# Patient Record
Sex: Female | Born: 2000 | Race: White | Hispanic: No | Marital: Single | Smoking: Never smoker
Health system: Southern US, Community
[De-identification: ages and names within clinical notes are randomized; demographics above are authoritative.]

## PROBLEM LIST (undated history)

## (undated) DIAGNOSIS — J45909 Unspecified asthma, uncomplicated: Secondary | ICD-10-CM

## (undated) HISTORY — PX: TYMPANOSTOMY TUBE PLACEMENT: SHX32

---
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
## 2022-05-06 IMAGING — CR DG CHEST 2V
1 series · 2 of 2 positions shown · non-contrast
Comparison: None.

CLINICAL DATA: Cough, congestion, and shortness of breath.

EXAM:
CHEST - 2 VIEW

[Series 1: dg chest 2 view · 0.14mm/px · 2 of 2 slices shown]
[im 1/2]
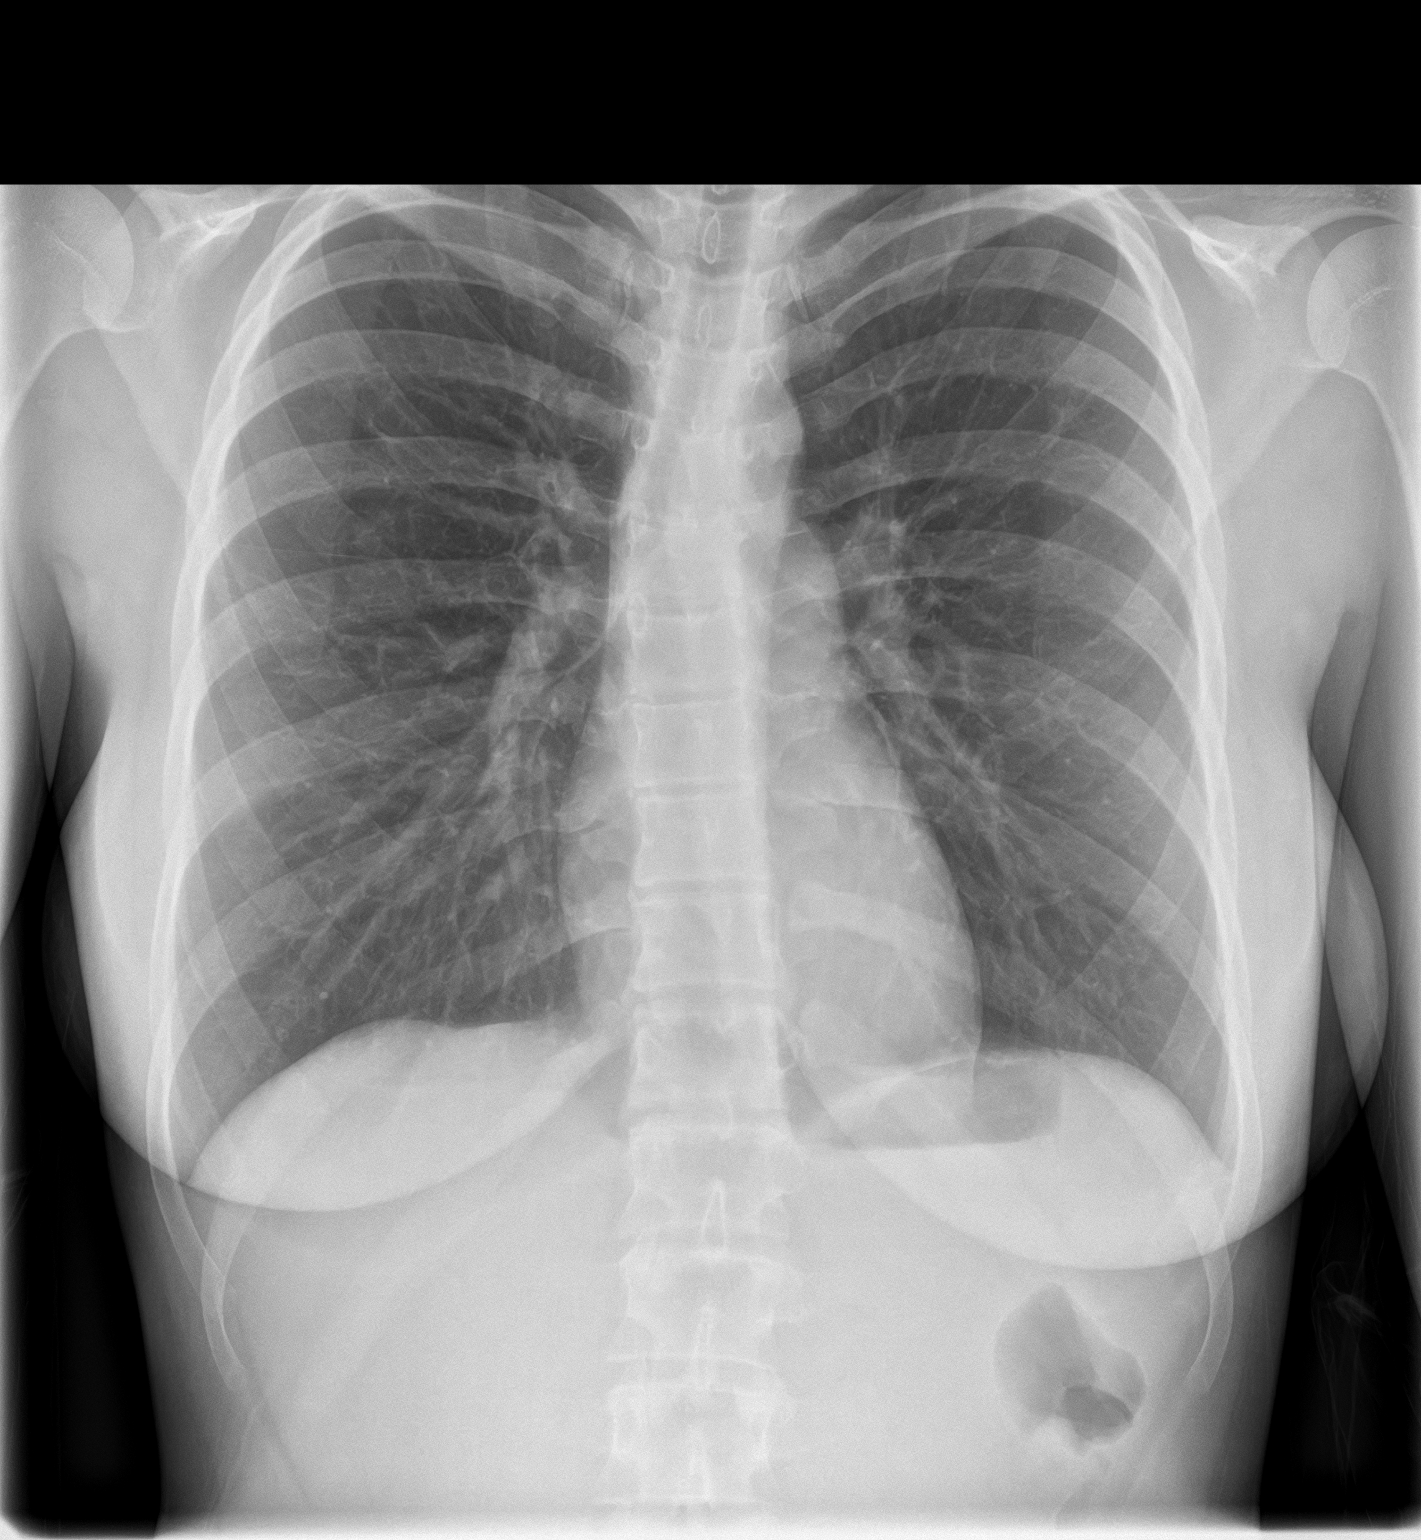
[im 2/2]
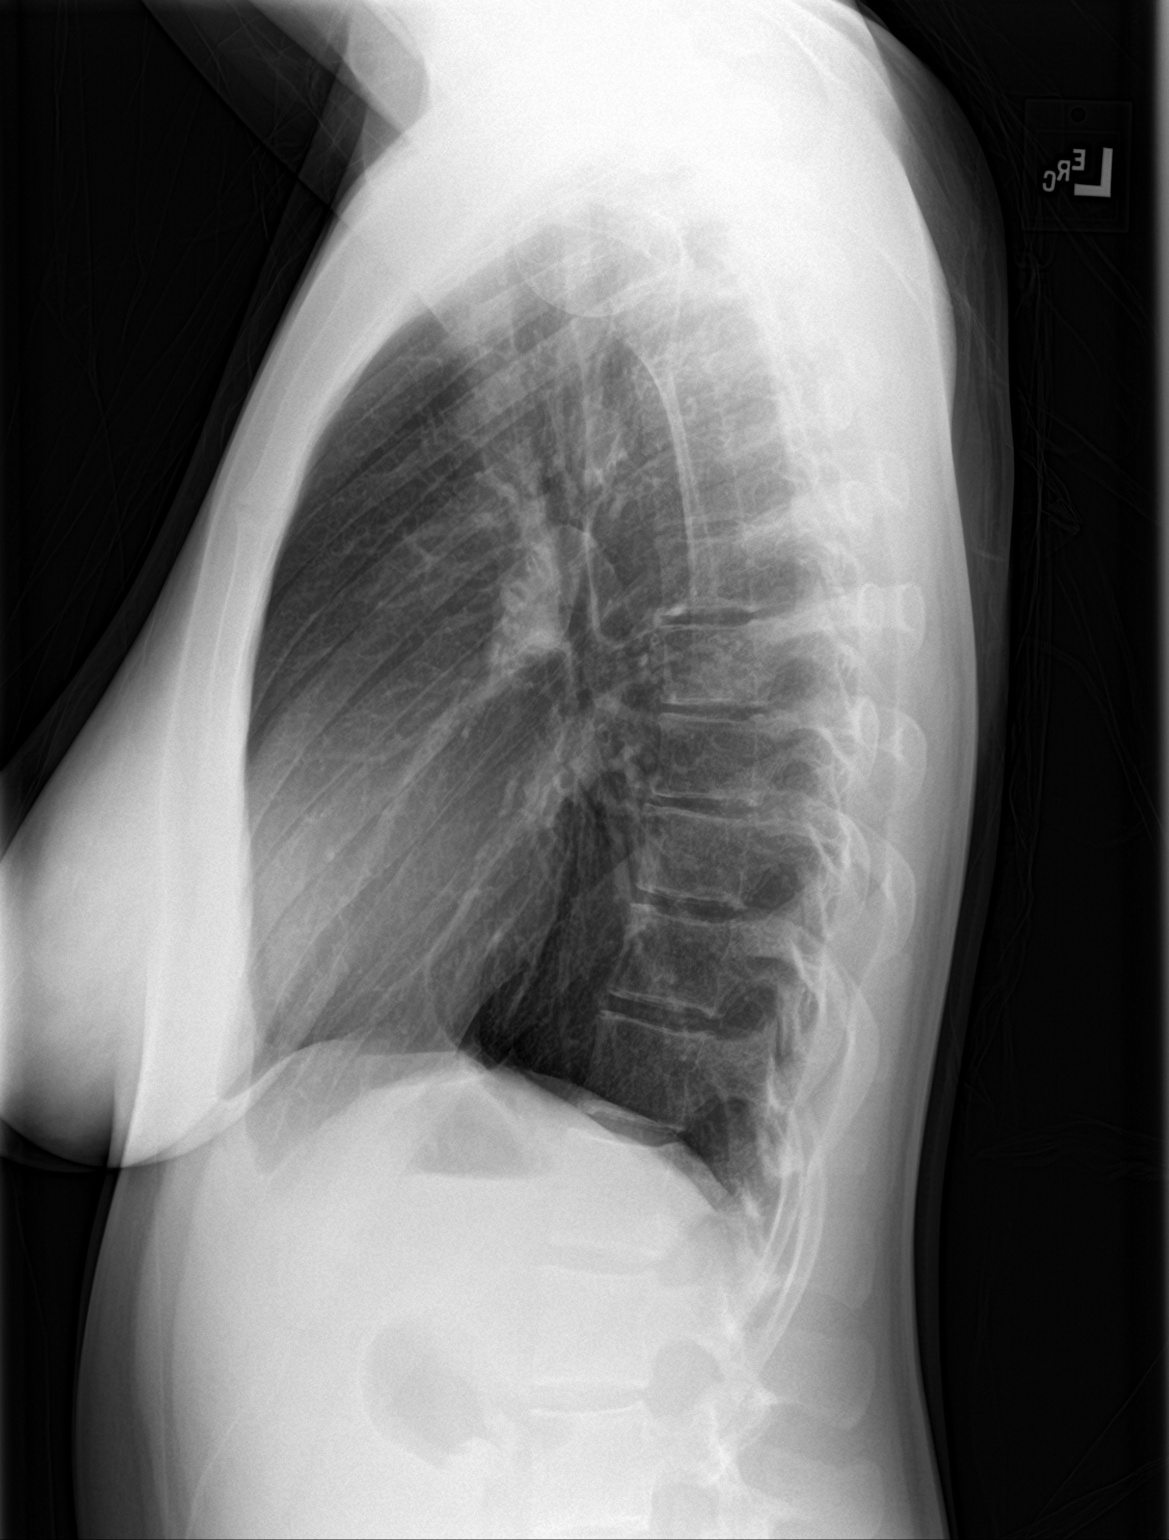

[2 of 2 positions shown; findings below may reference images not displayed]

FINDINGS: The cardiomediastinal contours are normal. The lungs are clear.
Pulmonary vasculature is normal. No consolidation, pleural effusion,
or pneumothorax. No acute osseous abnormalities are seen.
IMPRESSION: Negative radiographs of the chest.
